# Patient Record
Sex: Male | Born: 2005
Health system: Southern US, Community
[De-identification: ages and names within clinical notes are randomized; demographics above are authoritative.]

## PROBLEM LIST (undated history)

## (undated) DIAGNOSIS — Z789 Other specified health status: Secondary | ICD-10-CM

## (undated) DIAGNOSIS — Z889 Allergy status to unspecified drugs, medicaments and biological substances status: Secondary | ICD-10-CM

---
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
## 2023-09-17 DIAGNOSIS — Z131 Encounter for screening for diabetes mellitus: Secondary | ICD-10-CM | POA: Diagnosis not present

## 2023-09-17 DIAGNOSIS — F320 Major depressive disorder, single episode, mild: Secondary | ICD-10-CM | POA: Diagnosis not present

## 2023-09-17 DIAGNOSIS — F419 Anxiety disorder, unspecified: Secondary | ICD-10-CM | POA: Diagnosis not present

## 2023-09-17 DIAGNOSIS — E559 Vitamin D deficiency, unspecified: Secondary | ICD-10-CM | POA: Diagnosis not present

## 2023-09-17 DIAGNOSIS — R4189 Other symptoms and signs involving cognitive functions and awareness: Secondary | ICD-10-CM | POA: Diagnosis not present

## 2023-09-17 DIAGNOSIS — Z0001 Encounter for general adult medical examination with abnormal findings: Secondary | ICD-10-CM | POA: Diagnosis not present

## 2023-10-03 DIAGNOSIS — F411 Generalized anxiety disorder: Secondary | ICD-10-CM | POA: Diagnosis not present

## 2023-10-10 DIAGNOSIS — E559 Vitamin D deficiency, unspecified: Secondary | ICD-10-CM | POA: Diagnosis not present

## 2023-10-10 DIAGNOSIS — Z131 Encounter for screening for diabetes mellitus: Secondary | ICD-10-CM | POA: Diagnosis not present

## 2023-10-10 DIAGNOSIS — R4189 Other symptoms and signs involving cognitive functions and awareness: Secondary | ICD-10-CM | POA: Diagnosis not present

## 2023-10-10 DIAGNOSIS — Z113 Encounter for screening for infections with a predominantly sexual mode of transmission: Secondary | ICD-10-CM | POA: Diagnosis not present

## 2023-10-10 DIAGNOSIS — Z0001 Encounter for general adult medical examination with abnormal findings: Secondary | ICD-10-CM | POA: Diagnosis not present

## 2023-10-10 DIAGNOSIS — F419 Anxiety disorder, unspecified: Secondary | ICD-10-CM | POA: Diagnosis not present

## 2023-10-10 DIAGNOSIS — F320 Major depressive disorder, single episode, mild: Secondary | ICD-10-CM | POA: Diagnosis not present

## 2023-10-17 DIAGNOSIS — F320 Major depressive disorder, single episode, mild: Secondary | ICD-10-CM | POA: Diagnosis not present

## 2023-10-17 DIAGNOSIS — Z0001 Encounter for general adult medical examination with abnormal findings: Secondary | ICD-10-CM | POA: Diagnosis not present

## 2023-10-17 DIAGNOSIS — F419 Anxiety disorder, unspecified: Secondary | ICD-10-CM | POA: Diagnosis not present

## 2023-10-17 DIAGNOSIS — E559 Vitamin D deficiency, unspecified: Secondary | ICD-10-CM | POA: Diagnosis not present

## 2023-10-17 DIAGNOSIS — Z23 Encounter for immunization: Secondary | ICD-10-CM | POA: Diagnosis not present

## 2023-10-17 DIAGNOSIS — R4189 Other symptoms and signs involving cognitive functions and awareness: Secondary | ICD-10-CM | POA: Diagnosis not present

## 2023-10-31 DIAGNOSIS — F411 Generalized anxiety disorder: Secondary | ICD-10-CM | POA: Diagnosis not present

## 2023-12-03 DIAGNOSIS — H52203 Unspecified astigmatism, bilateral: Secondary | ICD-10-CM | POA: Diagnosis not present

## 2023-12-03 DIAGNOSIS — H5213 Myopia, bilateral: Secondary | ICD-10-CM | POA: Diagnosis not present

## 2023-12-10 DIAGNOSIS — H5213 Myopia, bilateral: Secondary | ICD-10-CM | POA: Diagnosis not present

## 2023-12-11 DIAGNOSIS — F411 Generalized anxiety disorder: Secondary | ICD-10-CM | POA: Diagnosis not present

## 2023-12-31 DIAGNOSIS — H5213 Myopia, bilateral: Secondary | ICD-10-CM | POA: Diagnosis not present

## 2023-12-31 DIAGNOSIS — F4011 Social phobia, generalized: Secondary | ICD-10-CM | POA: Diagnosis not present

## 2023-12-31 DIAGNOSIS — F411 Generalized anxiety disorder: Secondary | ICD-10-CM | POA: Diagnosis not present

## 2023-12-31 DIAGNOSIS — F332 Major depressive disorder, recurrent severe without psychotic features: Secondary | ICD-10-CM | POA: Diagnosis not present

## 2024-01-09 DIAGNOSIS — F411 Generalized anxiety disorder: Secondary | ICD-10-CM | POA: Diagnosis not present

## 2024-01-24 DIAGNOSIS — F411 Generalized anxiety disorder: Secondary | ICD-10-CM | POA: Diagnosis not present

## 2024-02-06 DIAGNOSIS — F411 Generalized anxiety disorder: Secondary | ICD-10-CM | POA: Diagnosis not present

## 2024-02-10 DIAGNOSIS — F332 Major depressive disorder, recurrent severe without psychotic features: Secondary | ICD-10-CM | POA: Diagnosis not present

## 2024-02-10 DIAGNOSIS — F411 Generalized anxiety disorder: Secondary | ICD-10-CM | POA: Diagnosis not present

## 2024-02-10 DIAGNOSIS — F4011 Social phobia, generalized: Secondary | ICD-10-CM | POA: Diagnosis not present

## 2024-02-20 DIAGNOSIS — F411 Generalized anxiety disorder: Secondary | ICD-10-CM | POA: Diagnosis not present

## 2024-03-10 DIAGNOSIS — F332 Major depressive disorder, recurrent severe without psychotic features: Secondary | ICD-10-CM | POA: Diagnosis not present

## 2024-03-10 DIAGNOSIS — F4011 Social phobia, generalized: Secondary | ICD-10-CM | POA: Diagnosis not present

## 2024-03-10 DIAGNOSIS — F411 Generalized anxiety disorder: Secondary | ICD-10-CM | POA: Diagnosis not present

## 2024-03-24 DIAGNOSIS — F411 Generalized anxiety disorder: Secondary | ICD-10-CM | POA: Diagnosis not present

## 2024-04-07 DIAGNOSIS — F4011 Social phobia, generalized: Secondary | ICD-10-CM | POA: Diagnosis not present

## 2024-04-07 DIAGNOSIS — F411 Generalized anxiety disorder: Secondary | ICD-10-CM | POA: Diagnosis not present

## 2024-04-07 DIAGNOSIS — F332 Major depressive disorder, recurrent severe without psychotic features: Secondary | ICD-10-CM | POA: Diagnosis not present

## 2024-04-14 DIAGNOSIS — E559 Vitamin D deficiency, unspecified: Secondary | ICD-10-CM | POA: Diagnosis not present

## 2024-04-21 DIAGNOSIS — R233 Spontaneous ecchymoses: Secondary | ICD-10-CM | POA: Diagnosis not present

## 2024-04-21 DIAGNOSIS — F411 Generalized anxiety disorder: Secondary | ICD-10-CM | POA: Diagnosis not present

## 2024-04-21 DIAGNOSIS — R636 Underweight: Secondary | ICD-10-CM | POA: Diagnosis not present

## 2024-04-21 DIAGNOSIS — R4189 Other symptoms and signs involving cognitive functions and awareness: Secondary | ICD-10-CM | POA: Diagnosis not present

## 2024-04-21 DIAGNOSIS — E559 Vitamin D deficiency, unspecified: Secondary | ICD-10-CM | POA: Diagnosis not present

## 2024-04-21 DIAGNOSIS — Z681 Body mass index (BMI) 19 or less, adult: Secondary | ICD-10-CM | POA: Diagnosis not present

## 2024-04-21 DIAGNOSIS — F320 Major depressive disorder, single episode, mild: Secondary | ICD-10-CM | POA: Diagnosis not present

## 2024-04-21 DIAGNOSIS — Z23 Encounter for immunization: Secondary | ICD-10-CM | POA: Diagnosis not present

## 2024-05-19 DIAGNOSIS — F411 Generalized anxiety disorder: Secondary | ICD-10-CM | POA: Diagnosis not present

## 2024-05-19 DIAGNOSIS — F4011 Social phobia, generalized: Secondary | ICD-10-CM | POA: Diagnosis not present

## 2024-05-19 DIAGNOSIS — F332 Major depressive disorder, recurrent severe without psychotic features: Secondary | ICD-10-CM | POA: Diagnosis not present

## 2024-06-16 DIAGNOSIS — F4011 Social phobia, generalized: Secondary | ICD-10-CM | POA: Diagnosis not present

## 2024-06-16 DIAGNOSIS — F411 Generalized anxiety disorder: Secondary | ICD-10-CM | POA: Diagnosis not present

## 2024-06-16 DIAGNOSIS — F332 Major depressive disorder, recurrent severe without psychotic features: Secondary | ICD-10-CM | POA: Diagnosis not present

## 2024-06-30 DIAGNOSIS — F411 Generalized anxiety disorder: Secondary | ICD-10-CM | POA: Diagnosis not present
